# Patient Record
Sex: Female | Born: 1960 | Race: Black or African American | Hispanic: No | Marital: Married | Smoking: Never smoker
Health system: Southern US, Community
[De-identification: ages and names within clinical notes are randomized; demographics above are authoritative.]

## PROBLEM LIST (undated history)

## (undated) DIAGNOSIS — R079 Chest pain, unspecified: Secondary | ICD-10-CM

## (undated) DIAGNOSIS — I10 Essential (primary) hypertension: Secondary | ICD-10-CM

## (undated) DIAGNOSIS — M5430 Sciatica, unspecified side: Secondary | ICD-10-CM

## (undated) DIAGNOSIS — E669 Obesity, unspecified: Secondary | ICD-10-CM

## (undated) DIAGNOSIS — D122 Benign neoplasm of ascending colon: Secondary | ICD-10-CM

## (undated) DIAGNOSIS — H9193 Unspecified hearing loss, bilateral: Secondary | ICD-10-CM

## (undated) DIAGNOSIS — M67912 Unspecified disorder of synovium and tendon, left shoulder: Secondary | ICD-10-CM

## (undated) DIAGNOSIS — R9431 Abnormal electrocardiogram [ECG] [EKG]: Secondary | ICD-10-CM

## (undated) DIAGNOSIS — M67911 Unspecified disorder of synovium and tendon, right shoulder: Secondary | ICD-10-CM

## (undated) HISTORY — DX: Essential (primary) hypertension: I10

## (undated) HISTORY — DX: Unspecified disorder of synovium and tendon, right shoulder: M67.912

## (undated) HISTORY — DX: Abnormal electrocardiogram (ECG) (EKG): R94.31

## (undated) HISTORY — DX: Unspecified disorder of synovium and tendon, right shoulder: M67.911

## (undated) HISTORY — DX: Unspecified hearing loss, bilateral: H91.93

## (undated) HISTORY — DX: Sciatica, unspecified side: M54.30

## (undated) HISTORY — DX: Chest pain, unspecified: R07.9

## (undated) HISTORY — DX: Benign neoplasm of ascending colon: D12.2

## (undated) HISTORY — DX: Obesity, unspecified: E66.9

---

## 1999-04-05 ENCOUNTER — Other Ambulatory Visit: Admission: RE | Admit: 1999-04-05 | Discharge: 1999-04-05 | Payer: Self-pay | Admitting: Obstetrics and Gynecology

## 2006-07-11 ENCOUNTER — Encounter: Admission: RE | Admit: 2006-07-11 | Discharge: 2006-07-11 | Payer: Self-pay | Admitting: Internal Medicine

## 2006-08-09 ENCOUNTER — Encounter: Admission: RE | Admit: 2006-08-09 | Discharge: 2006-08-09 | Payer: Self-pay | Admitting: Internal Medicine

## 2006-09-05 ENCOUNTER — Encounter: Admission: RE | Admit: 2006-09-05 | Discharge: 2006-09-05 | Payer: Self-pay | Admitting: Internal Medicine

## 2010-11-29 ENCOUNTER — Other Ambulatory Visit
Admission: RE | Admit: 2010-11-29 | Discharge: 2010-11-29 | Payer: Self-pay | Source: Home / Self Care | Admitting: Family Medicine

## 2010-12-27 ENCOUNTER — Encounter
Admission: RE | Admit: 2010-12-27 | Discharge: 2010-12-27 | Payer: Self-pay | Source: Home / Self Care | Attending: Family Medicine | Admitting: Family Medicine

## 2011-01-07 ENCOUNTER — Encounter
Admission: RE | Admit: 2011-01-07 | Discharge: 2011-01-07 | Payer: Self-pay | Source: Home / Self Care | Attending: Family Medicine | Admitting: Family Medicine

## 2011-01-08 ENCOUNTER — Other Ambulatory Visit: Payer: Self-pay | Admitting: Family Medicine

## 2011-01-08 DIAGNOSIS — N6320 Unspecified lump in the left breast, unspecified quadrant: Secondary | ICD-10-CM

## 2011-01-09 ENCOUNTER — Encounter: Payer: Self-pay | Admitting: Family Medicine

## 2011-01-24 ENCOUNTER — Other Ambulatory Visit: Payer: Self-pay | Admitting: Family Medicine

## 2011-01-24 DIAGNOSIS — N6320 Unspecified lump in the left breast, unspecified quadrant: Secondary | ICD-10-CM

## 2011-01-27 ENCOUNTER — Other Ambulatory Visit: Payer: Self-pay

## 2011-03-01 ENCOUNTER — Other Ambulatory Visit: Payer: Self-pay

## 2011-11-04 IMAGING — MG MM DIAGNOSTIC LTD LEFT
3 series · 3 of 3 positions shown · non-contrast
Comparison: Multiple priors

CLINICAL DATA: Abnormal screening mammogram, left breast

DIGITAL DIAGNOSTIC LEFT MAMMOGRAM WITHOUT CAD AND LEFT BREAST
ULTRASOUND:

[L CC]
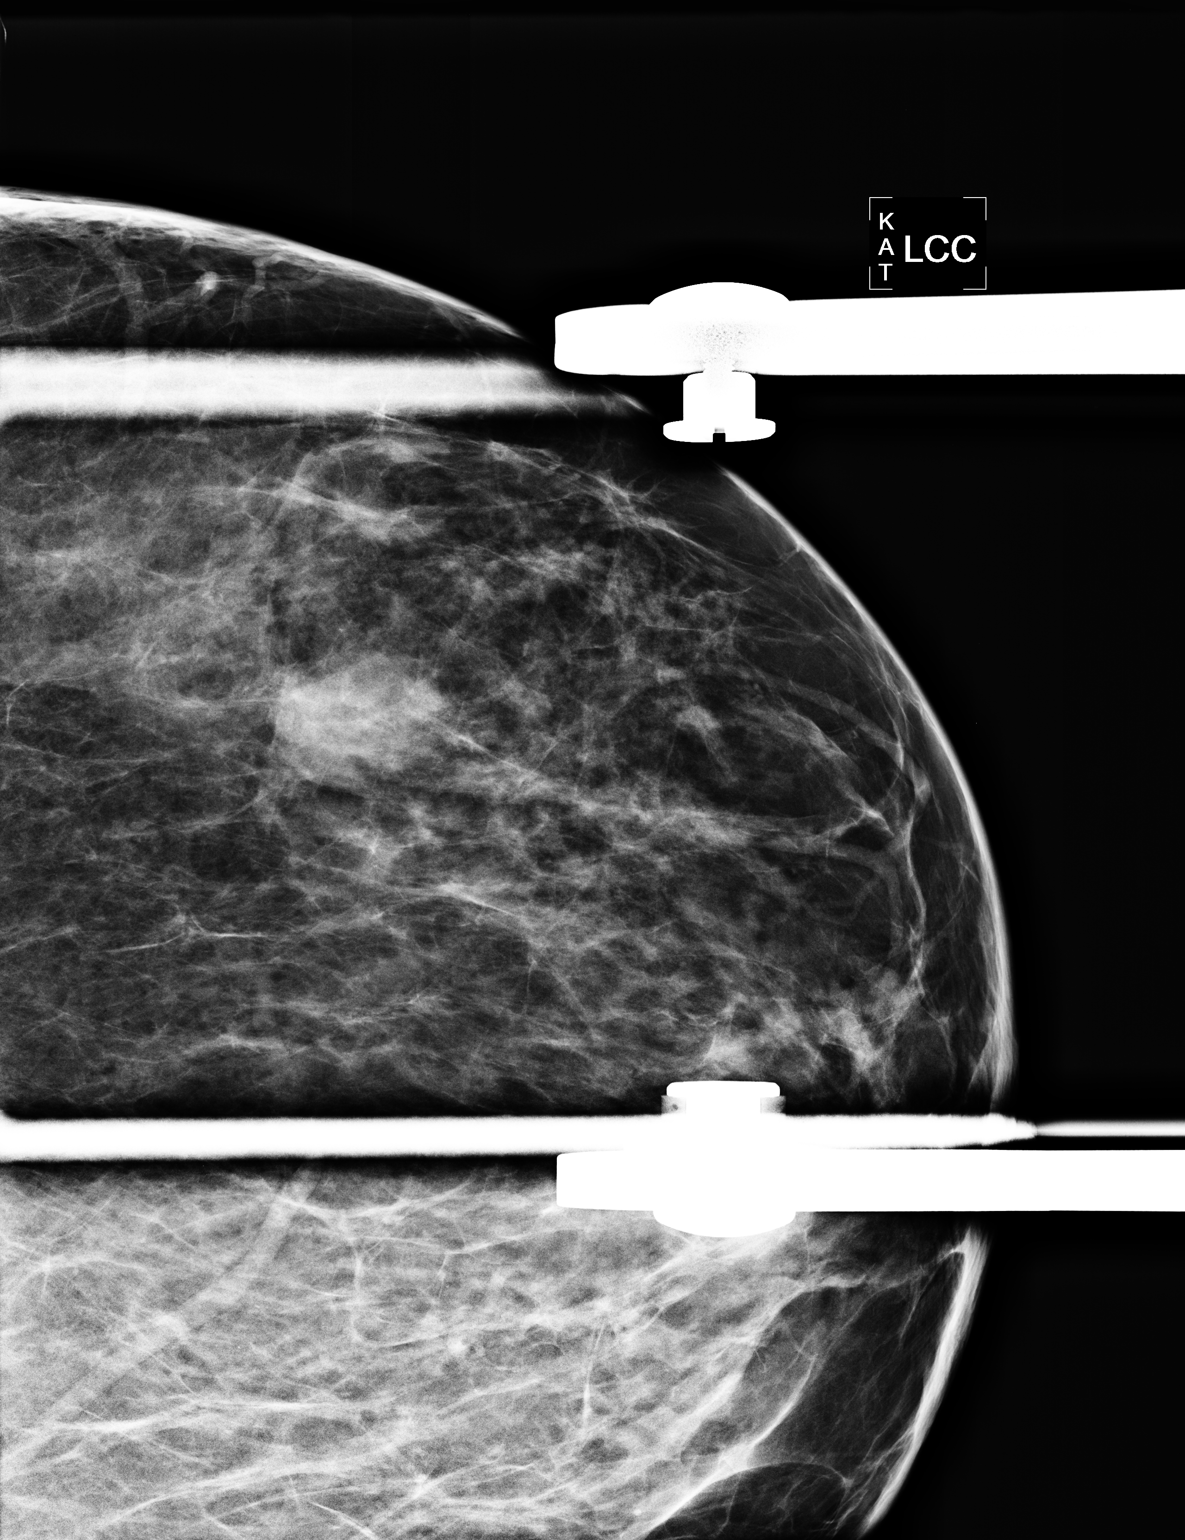

[L MLO (1 of 2)]
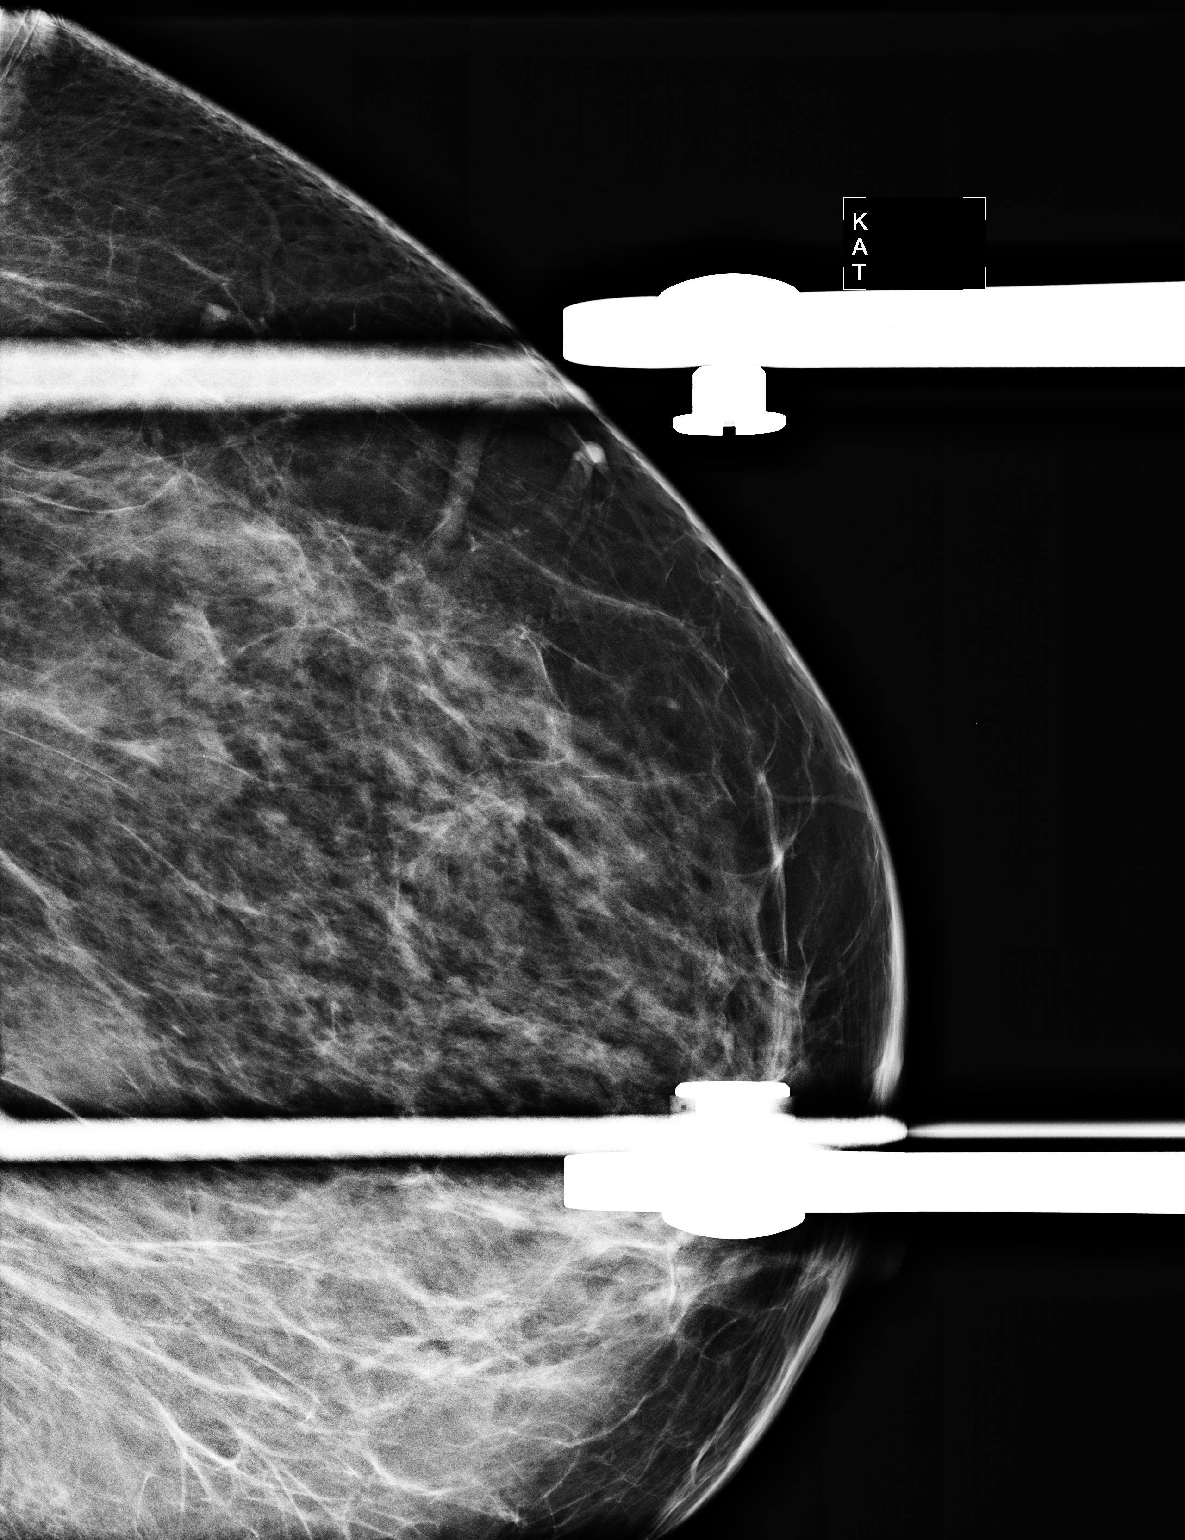

[L MLO (2 of 2)]
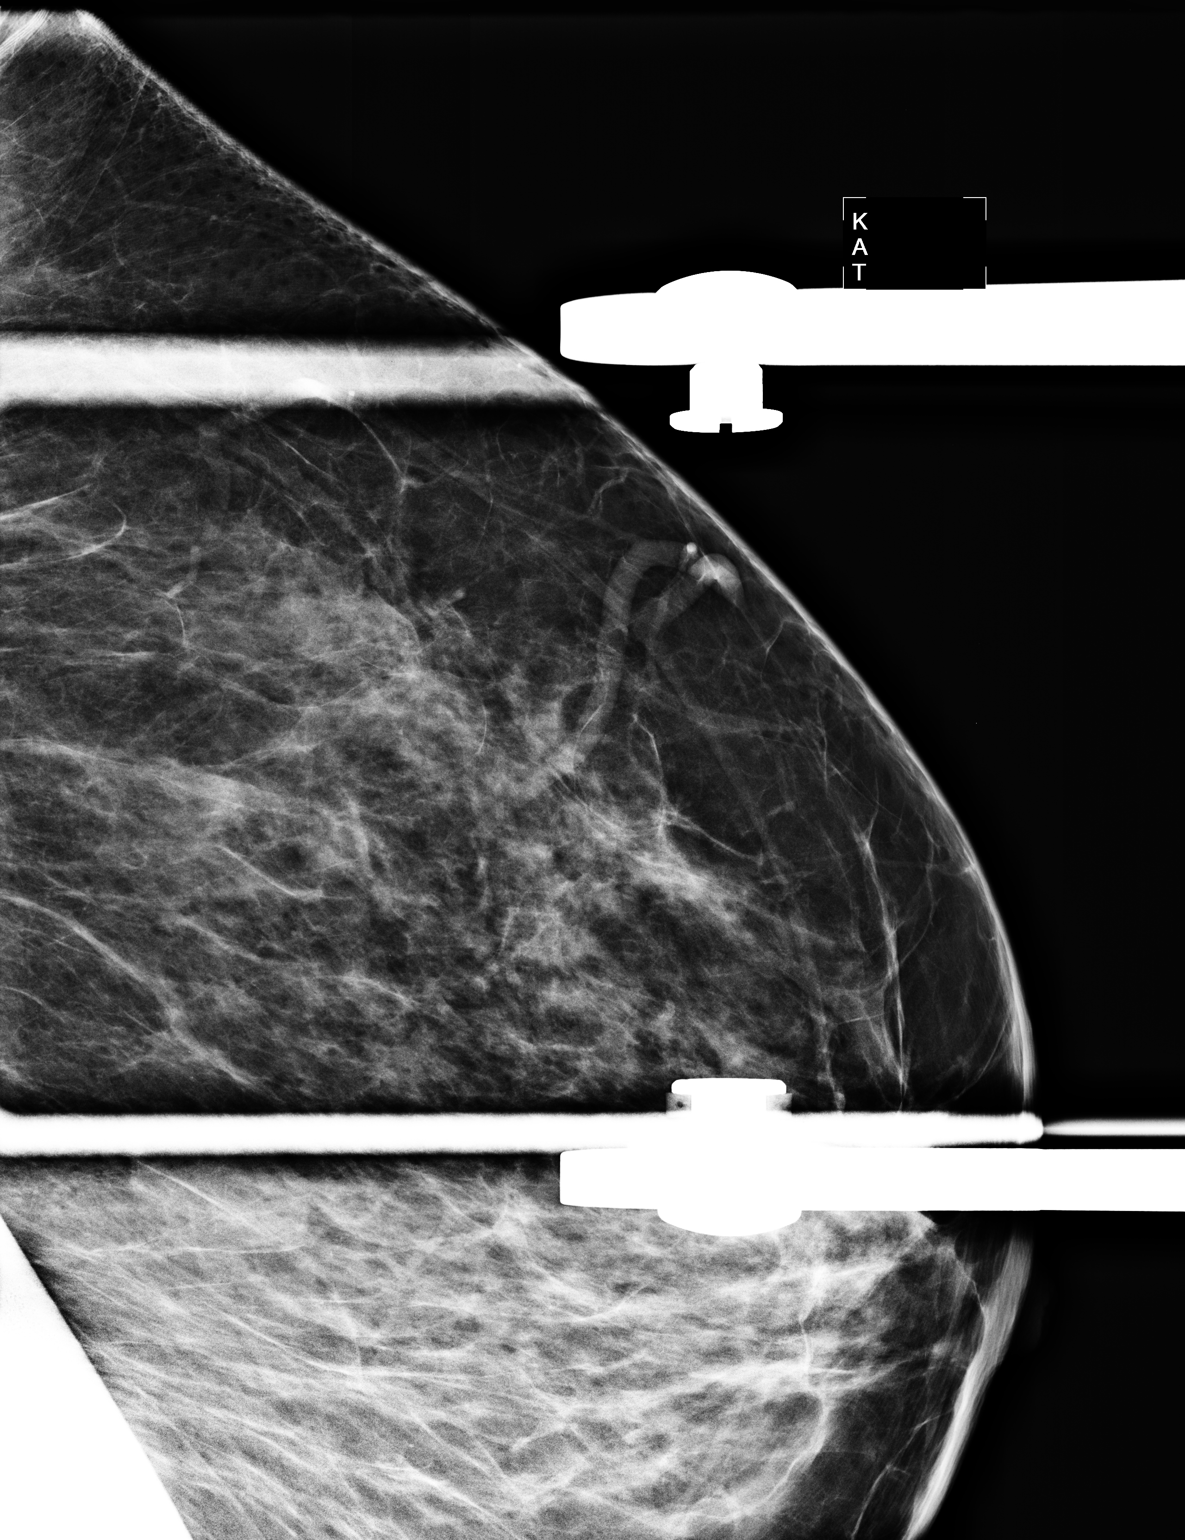

[3 of 3 positions shown; findings below may reference images not displayed]

FINDINGS: Spot compression views confirm the presence of a mass in
the lateral central aspect of the left breast, middle one third
with partially circumscribed partially indistinct margins.

On physical exam, I palpate normal tissue in the lateral central
aspect of the left breast.

Ultrasound is performed, showing a predominantly hypoechoic mass
with ill-defined margins and peripheral areas of increased
echogenicity and some areas of shadowing in the 3 o'clock position,
5 cm from the nipple measuring 1.9 x 1.0 x 1.6 cm.  Normal axillary
contents are imaged.

Options were discussed with the patient including 6-month follow-up
versus biopsy.  However, considering this is a change from the
mammogram, biopsy is recommended and will be scheduled as an the
patient is able.
IMPRESSION: Mass, left breast which biopsy is recommended in will be scheduled
at the patient's convenience.

BI-RADS CATEGORY 4:  Suspicious abnormality - biopsy should be
considered.

## 2016-07-13 ENCOUNTER — Ambulatory Visit: Payer: Self-pay | Admitting: Neurology

## 2019-06-24 ENCOUNTER — Other Ambulatory Visit: Payer: Self-pay | Admitting: Internal Medicine

## 2019-06-24 ENCOUNTER — Other Ambulatory Visit: Payer: Self-pay

## 2019-06-24 DIAGNOSIS — Z20822 Contact with and (suspected) exposure to covid-19: Secondary | ICD-10-CM

## 2019-06-29 LAB — NOVEL CORONAVIRUS, NAA: SARS-CoV-2, NAA: NOT DETECTED

## 2019-07-25 ENCOUNTER — Encounter: Payer: Self-pay | Admitting: Interventional Cardiology

## 2019-08-14 ENCOUNTER — Other Ambulatory Visit: Payer: Self-pay

## 2019-08-14 ENCOUNTER — Ambulatory Visit (INDEPENDENT_AMBULATORY_CARE_PROVIDER_SITE_OTHER): Payer: Managed Care, Other (non HMO) | Admitting: Interventional Cardiology

## 2019-08-14 ENCOUNTER — Encounter: Payer: Self-pay | Admitting: Interventional Cardiology

## 2019-08-14 VITALS — BP 128/76 | HR 62 | Ht 67.0 in | Wt 244.4 lb

## 2019-08-14 DIAGNOSIS — M329 Systemic lupus erythematosus, unspecified: Secondary | ICD-10-CM

## 2019-08-14 DIAGNOSIS — I10 Essential (primary) hypertension: Secondary | ICD-10-CM

## 2019-08-14 DIAGNOSIS — R0789 Other chest pain: Secondary | ICD-10-CM

## 2019-08-14 DIAGNOSIS — E668 Other obesity: Secondary | ICD-10-CM

## 2019-08-14 DIAGNOSIS — R0683 Snoring: Secondary | ICD-10-CM

## 2019-08-14 DIAGNOSIS — Z7189 Other specified counseling: Secondary | ICD-10-CM

## 2019-08-14 DIAGNOSIS — R9431 Abnormal electrocardiogram [ECG] [EKG]: Secondary | ICD-10-CM

## 2019-08-14 NOTE — Patient Instructions (Addendum)
Medication Instructions:  Your physician recommends that you continue on your current medications as directed. Please refer to the Current Medication list given to you today.  If you need a refill on your cardiac medications before your next appointment, please call your pharmacy.   Lab work: Lipid and A1C the same day as your echocardiogram.  Please make sure you come fasting for these labs (nothing to eat or drink after midnight except water and black coffee).  If you have labs (blood work) drawn today and your tests are completely normal, you will receive your results only by: Marland Kitchen MyChart Message (if you have MyChart) OR . A paper copy in the mail If you have any lab test that is abnormal or we need to change your treatment, we will call you to review the results.  Testing/Procedures: Your physician has requested that you have an echocardiogram. Echocardiography is a painless test that uses sound waves to create images of your heart. It provides your doctor with information about the size and shape of your heart and how well your heart's chambers and valves are working. This procedure takes approximately one hour. There are no restrictions for this procedure.  Follow-Up: Your physician recommends that you schedule a follow-up appointment as needed with Dr. Tamala Julian.    Any Other Special Instructions Will Be Listed Below (If Applicable).

## 2019-08-14 NOTE — Progress Notes (Signed)
Cardiology Office Note:    Date:  08/14/2019   ID:  Kristi Hunt, DOB 1961/08/28, MRN [id number]  PCP:  Aura Dials, MD  Cardiologist:  No primary care provider on file.   Referring MD: Carolee Rota, NP   Chief Complaint  Patient presents with  . Advice Only    Abnormal ECG    History of Present Illness:    Kristi Hunt is a 58 y.o. female with a hx of abnormal EKG with diagnosis of possible prior myocardial infarction referred by Dr. Loura Pardon, NP.  The patient has had intermittent chest burning for several months.  Pantoprazole is been started in the chest symptom improved.  In July she had sudden onset of nausea, vomiting, and worsening of the discomfort with discomfort in her arm.  Several days later she went into the [facility] office, was evaluated, had an EKG, and the diagnosis was "anteroseptal MI".  She was told by the provider that she had had a recent myocardial infarction.  That is the reason that she is here today.  Since learning this, the patient has been concerned about the possibility of heart disease.  She is concerned that her job at [employer] could be aggravating her underlying condition.  She does a lot of lifting and physical activity at work and is not bothered by chest discomfort or excessive dyspnea.  Other components of her history include a longstanding hypertension diagnosis for greater than 15 years.  Marginal blood pressure control.  History of snoring and fatigue during the daytime.  She has never been a smoker.  She does have a positive family history of vascular disease including hypertension in both her mother and father and a brother has had aortic dissection.  Past Medical History:  Diagnosis Date  . Abnormal EKG   . Adenomatous polyp of ascending colon   . Chest pain   . Hearing loss, bilateral   . Hypertension   . Obesity   . Sciatica   . Tendinopathy of both shoulders     Past Surgical History:  Procedure  Laterality Date  . NO PAST SURGERIES      Current Medications: Current Meds  Medication Sig  . aspirin EC 81 MG tablet Take 81 mg by mouth daily.  . hydrochlorothiazide (HYDRODIURIL) 25 MG tablet Take 25 mg by mouth daily.  . hydroxychloroquine (PLAQUENIL) 200 MG tablet Take 200 mg by mouth daily.  Marland Kitchen lisinopril (ZESTRIL) 20 MG tablet Take 20 mg by mouth daily.  . metoprolol succinate (TOPROL-XL) 50 MG 24 hr tablet Take 50 mg by mouth daily. Take with or immediately following a meal.  . pantoprazole (PROTONIX) 40 MG tablet Take 40 mg by mouth daily.     Allergies:   Penicillins   Social History   Socioeconomic History  . Marital status: Unknown    Spouse name: Not on file  . Number of children: Not on file  . Years of education: Not on file  . Highest education level: Not on file  Occupational History  . Not on file  Social Needs  . Financial resource strain: Not on file  . Food insecurity    Worry: Not on file    Inability: Not on file  . Transportation needs    Medical: Not on file    Non-medical: Not on file  Tobacco Use  . Smoking status: Never Smoker  . Smokeless tobacco: Never Used  Substance and Sexual Activity  . Alcohol use: Never  Frequency: Never  . Drug use: Never  . Sexual activity: Not on file    Comment: MARRIED  Lifestyle  . Physical activity    Days per week: Not on file    Minutes per session: Not on file  . Stress: Not on file  Relationships  . Social Herbalist on phone: Not on file    Gets together: Not on file    Attends religious service: Not on file    Active member of club or organization: Not on file    Attends meetings of clubs or organizations: Not on file    Relationship status: Not on file  Other Topics Concern  . Not on file  Social History Narrative  . Not on file     Family History: The patient's family history includes Aortic dissection in her brother; Cancer in her father; Healthy in her daughter, daughter  and another family member; Heart disease in her father and mother; Hypertension in her brother, father, and mother; Other in her brother and maternal aunt.  ROS:   Please see the history of present illness.    Lupus erythematosus is a current diagnosis for which she takes hydrochloric when.  She is under stress at work and also about her health.  When she gets home from work she has to take a nap because she is so fatigued and gets sleepy quickly.  All other systems reviewed and are negative.  EKGs/Labs/Other Studies Reviewed:    The following studies were reviewed today: No cardiac evaluation or imaging  EKG:  EKG EKG performed today demonstrates normal appearance.  Normal sinus rhythm at 62 bpm.  No evidence of infarction.  When compared to the tracing performed at [facility] on May 25, 2019, the only difference is that there is now normal QRS transition in the precordial leads versus then.  The computer algorithm on that tracing read anteroseptal infarct.  This likely confirms that a lead position change is what accounted for the infarct pattern and was normalized on EKG today with proper positioning.  Recent Labs: No results found for requested labs within last 8760 hours.  Recent Lipid Panel No results found for: CHOL, TRIG, HDL, CHOLHDL, VLDL, LDLCALC, LDLDIRECT  Physical Exam:    VS:  BP 128/76   Pulse 62   Ht 5\' 7"  (1.702 m)   Wt 244 lb 6.4 oz (110.9 kg)   SpO2 98%   BMI 38.28 kg/m     Wt Readings from Last 3 Encounters:  08/14/19 244 lb 6.4 oz (110.9 kg)     GEN: Obese. No acute distress HEENT: Normal NECK: No JVD. LYMPHATICS: No lymphadenopathy CARDIAC:  RRR without murmur, gallop, or edema. VASCULAR:  Normal Pulses. No bruits. RESPIRATORY:  Clear to auscultation without rales, wheezing or rhonchi  ABDOMEN: Soft, non-tender, non-distended, No pulsatile mass, MUSCULOSKELETAL: No deformity  SKIN: Warm and dry NEUROLOGIC:  Alert and oriented x 3 PSYCHIATRIC:   Normal affect   ASSESSMENT:    1. Abnormal EKG   2. Other chest pain   3. Essential hypertension   4. Snoring   5. Systemic lupus erythematosus, unspecified SLE type, unspecified organ involvement status ([place])   6. Moderate obesity   7. Educated About Covid-19 Virus Infection    PLAN:    In order of problems listed above:  1. Today's EKG is normal.  I believe the EKG that raise the concern have the appearance of anteroseptal infarct because of lead position  variance. 2. A 2D Doppler echocardiogram will be done to help investigate her complaint of chest pain.  She has potential explanations other than coronary disease including lupus which can be associated with pericarditis.  Her story also suggest the possibility of sleep apnea.  The echo will help to exclude pulmonary hypertension.  Finally the echo will rule out a regional wall motion abnormality. 3. Target blood pressure is 130/80 mmHg.  Continue current therapy. 4. Depending upon findings from the echo, we may need to consider a sleep study. 5. Not specifically addressed. 6. Encourage aerobic activity and decrease carbohydrate in her diet. 7. Social distancing, mask wearing, and handwashing is encouraged.  To properly assess cardiovascular risk for possible future cardiac events she will need to have a hemoglobin A1c, lipid panel, and we will consider a coronary calcium score and sleep study.  Clinical follow-up with me in 4 to 6 weeks after more data is available.   Medication Adjustments/Labs and Tests Ordered: Current medicines are reviewed at length with the patient today.  Concerns regarding medicines are outlined above.  Orders Placed This Encounter  Procedures  . HgB A1c  . Lipid panel  . EKG 12-Lead  . ECHOCARDIOGRAM COMPLETE   No orders of the defined types were placed in this encounter.   Patient Instructions  Medication Instructions:  Your physician recommends that you continue on your current medications as  directed. Please refer to the Current Medication list given to you today.  If you need a refill on your cardiac medications before your next appointment, please call your pharmacy.   Lab work: Lipid and A1C the same day as your echocardiogram.  Please make sure you come fasting for these labs (nothing to eat or drink after midnight except water and black coffee).  If you have labs (blood work) drawn today and your tests are completely normal, you will receive your results only by: Marland Kitchen MyChart Message (if you have MyChart) OR . A paper copy in the mail If you have any lab test that is abnormal or we need to change your treatment, we will call you to review the results.  Testing/Procedures: Your physician has requested that you have an echocardiogram. Echocardiography is a painless test that uses sound waves to create images of your heart. It provides your doctor with information about the size and shape of your heart and how well your heart's chambers and valves are working. This procedure takes approximately one hour. There are no restrictions for this procedure.  Follow-Up: Your physician recommends that you schedule a follow-up appointment as needed with Dr. Tamala Julian.    Any Other Special Instructions Will Be Listed Below (If Applicable).      Signed, Sinclair Grooms, MD  08/14/2019 3:33 PM    [facility]

## 2019-08-21 ENCOUNTER — Other Ambulatory Visit: Payer: Self-pay

## 2019-08-21 ENCOUNTER — Other Ambulatory Visit: Payer: Managed Care, Other (non HMO) | Admitting: *Deleted

## 2019-08-21 ENCOUNTER — Ambulatory Visit (HOSPITAL_COMMUNITY): Payer: Managed Care, Other (non HMO) | Attending: Cardiovascular Disease

## 2019-08-21 DIAGNOSIS — R9431 Abnormal electrocardiogram [ECG] [EKG]: Secondary | ICD-10-CM

## 2019-08-21 DIAGNOSIS — R0789 Other chest pain: Secondary | ICD-10-CM

## 2019-08-22 LAB — LIPID PANEL
Chol/HDL Ratio: 2.3 ratio (ref 0.0–4.4)
Cholesterol, Total: 228 mg/dL — ABNORMAL HIGH (ref 100–199)
HDL: 98 mg/dL (ref >39)
LDL Chol Calc (NIH): 122 mg/dL — ABNORMAL HIGH (ref 0–99)
Triglycerides: 47 mg/dL (ref 0–149)
VLDL Cholesterol Cal: 8 mg/dL (ref 5–40)

## 2019-08-22 LAB — HEMOGLOBIN A1C
Est. average glucose Bld gHb Est-mCnc: 123 mg/dL
Hgb A1c MFr Bld: 5.9 % — ABNORMAL HIGH (ref 4.8–5.6)

## 2019-08-23 ENCOUNTER — Telehealth: Payer: Self-pay | Admitting: Interventional Cardiology

## 2019-08-23 ENCOUNTER — Telehealth: Payer: Self-pay

## 2019-08-23 NOTE — Telephone Encounter (Signed)
New Message ° ° °Patient returning your call. °

## 2019-08-23 NOTE — Telephone Encounter (Signed)
Notes recorded by Frederik Schmidt, RN on 08/23/2019 at 11:30 AM EDT  The patient has been notified of the result and verbalized understanding. All questions (if any) were answered.  Frederik Schmidt, RN 08/23/2019 11:30 AM

## 2019-08-23 NOTE — Telephone Encounter (Signed)
Notes recorded by Frederik Schmidt, RN on 08/23/2019 at 9:37 AM EDT  Mailbox full 9/4  ------

## 2020-01-17 ENCOUNTER — Other Ambulatory Visit: Payer: Self-pay

## 2020-01-17 ENCOUNTER — Ambulatory Visit: Payer: Managed Care, Other (non HMO) | Attending: Internal Medicine

## 2020-01-17 DIAGNOSIS — Z20822 Contact with and (suspected) exposure to covid-19: Secondary | ICD-10-CM

## 2020-01-18 LAB — NOVEL CORONAVIRUS, NAA: SARS-CoV-2, NAA: NOT DETECTED

## 2020-01-23 ENCOUNTER — Telehealth: Payer: Self-pay | Admitting: *Deleted

## 2020-01-23 NOTE — Telephone Encounter (Signed)
Pt advised that her covid 19 result is negative. She voiced understanding.

## 2020-03-24 NOTE — Progress Notes (Signed)
Cardiology Office Note:    Date:  03/25/2020   ID:  Kristi Hunt, DOB 1961/01/25, MRN [id number]  PCP:  Aura Dials, MD  Cardiologist:  Sinclair Grooms, MD   Referring MD: Aura Dials, MD   Chief Complaint  Patient presents with  . Chest Pain    History of Present Illness:    Kristi Hunt is a 59 y.o. female with a hx of chest pain, hypertension, abnormal resting ECG, obesity, SLE, and small pericardial effusion 08/2019.  Says that she feels better now.  Has not had recurrent chest pain.  She does carry the diagnosis of lupus and is on hydrochloric when 200 mg daily.  She had right rotator cuff tear and has been out of work for the past several months recuperating from that.  She has back at low low intensity duty at work and it seems to be doing okay.  She was seen last, complained of chest pain, in retrospect she realizes that she had been drinking contaminated juice from a container that had black mold.  She wonders if she was having any kind of reaction to that.  Past Medical History:  Diagnosis Date  . Abnormal EKG   . Adenomatous polyp of ascending colon   . Chest pain   . Hearing loss, bilateral   . Hypertension   . Obesity   . Sciatica   . Tendinopathy of both shoulders     Past Surgical History:  Procedure Laterality Date  . NO PAST SURGERIES      Current Medications: Current Meds  Medication Sig  . aspirin EC 81 MG tablet Take 81 mg by mouth daily.  . hydrochlorothiazide (HYDRODIURIL) 25 MG tablet Take 25 mg by mouth daily.  . hydroxychloroquine (PLAQUENIL) 200 MG tablet Take 200 mg by mouth daily.  Marland Kitchen lisinopril (ZESTRIL) 20 MG tablet Take 20 mg by mouth daily.  . metoprolol succinate (TOPROL-XL) 50 MG 24 hr tablet Take 50 mg by mouth daily. Take with or immediately following a meal.     Allergies:   Penicillins   Social History   Socioeconomic History  . Marital status: Unknown    Spouse name: Not on file  . Number of children: Not on file    . Years of education: Not on file  . Highest education level: Not on file  Occupational History  . Not on file  Tobacco Use  . Smoking status: Never Smoker  . Smokeless tobacco: Never Used  Substance and Sexual Activity  . Alcohol use: Never  . Drug use: Never  . Sexual activity: Not on file    Comment: MARRIED  Other Topics Concern  . Not on file  Social History Narrative  . Not on file   Social Determinants of Health   Financial Resource Strain:   . Difficulty of Paying Living Expenses:   Food Insecurity:   . Worried About Charity fundraiser in the Last Year:   . Arboriculturist in the Last Year:   Transportation Needs:   . Film/video editor (Medical):   Marland Kitchen Lack of Transportation (Non-Medical):   Physical Activity:   . Days of Exercise per Week:   . Minutes of Exercise per Session:   Stress:   . Feeling of Stress :   Social Connections:   . Frequency of Communication with Friends and Family:   . Frequency of Social Gatherings with Friends and Family:   . Attends Religious Services:   . Active  Member of Clubs or Organizations:   . Attends Archivist Meetings:   Marland Kitchen Marital Status:      Family History: The patient's family history includes Aortic dissection in her brother; Cancer in her father; Healthy in her daughter, daughter and another family member; Heart disease in her father and mother; Hypertension in her brother, father, and mother; Other in her brother and maternal aunt.  ROS:   Please see the history of present illness.    Lupus causes her joints it feels stiff.  No rash or kidney involvement.  All other systems reviewed and are negative.  EKGs/Labs/Other Studies Reviewed:    The following studies were reviewed today:  ECHOCARDIOGRAM 08/2019: IMPRESSIONS    1. Normal echocardiogram.  2. The left ventricle has normal systolic function with an ejection  fraction of 60-65%. The cavity size was normal. Left ventricular diastolic   parameters were normal.  3. The right ventricle has normal systolic function. The cavity was  normal. There is no increase in right ventricular wall thickness. Right  ventricular systolic pressure TR signal inadequate for RVSP assessment.  4. The mitral valve is grossly normal. Mild thickening of the mitral  valve leaflet. There is mild mitral annular calcification present.  5. The aortic valve is tricuspid. No stenosis of the aortic valve.  6. The aorta is normal unless otherwise noted.  7. The aortic root is normal in size and structure.  8. The average left ventricular global longitudinal strain is -19.5 %.  9. When compared to the prior study: No comparison.  10. Trivial pericardial effusion is present.  11. The pericardial effusion is circumferential.   EKG:  EKG performed in August 2020 demonstrating prominent voltage, normal sinus rhythm, and no acute abnormality.  Recent Labs: No results found for requested labs within last 8760 hours.  Recent Lipid Panel    Component Value Date/Time   CHOL 228 (H) 08/21/2019 1018   TRIG 47 08/21/2019 1018   HDL 98 08/21/2019 1018   CHOLHDL 2.3 08/21/2019 1018   LDLCALC 122 (H) 08/21/2019 1018    Physical Exam:    VS:  BP 122/72   Pulse 74   Ht 5\' 7"  (1.702 m)   Wt 254 lb 12.8 oz (115.6 kg)   SpO2 97%   BMI 39.91 kg/m     Wt Readings from Last 3 Encounters:  03/25/20 254 lb 12.8 oz (115.6 kg)  08/14/19 244 lb 6.4 oz (110.9 kg)     GEN: Obese. No acute distress HEENT: Normal NECK: No JVD. LYMPHATICS: No lymphadenopathy CARDIAC:  RRR without murmur, gallop, or edema. VASCULAR:  Normal Pulses. No bruits. RESPIRATORY:  Clear to auscultation without rales, wheezing or rhonchi  ABDOMEN: Soft, non-tender, non-distended, No pulsatile mass, MUSCULOSKELETAL: No deformity  SKIN: Warm and dry NEUROLOGIC:  Alert and oriented x 3 PSYCHIATRIC:  Normal affect   ASSESSMENT:    1. Other chest pain   2. Essential hypertension    3. Systemic lupus erythematosus, unspecified SLE type, unspecified organ involvement status ([place])   4. Snoring   5. Moderate obesity   6. Educated about COVID-19 virus infection   7. Pericarditis with effusion    PLAN:    In order of problems listed above:  1. The chest pain that she had when seen late last year has not recurred.  The echo in conjunction with evaluation revealed a small circumferential pericardial effusion.  With her history of lupus it is conceivable that pericarditis was present related  to systemic lupus erythematosus.  I helped her to understand a potential connection.  In absence of pain or symptoms at this time, I do not feel that any further evaluation is currently needed.  Should she develop shortness of breath or recurrent chest pain, an echo should be repeated. 2. Excellent current blood pressure 3. Under good control on hydrochloric when 4. May need evaluation for sleep apnea 5. Unchanged 6. Has gotten 2 doses of COVID-19 vaccine 7. I made a correlation between pericardial effusion and lupus.  This should be linked in the future if symptoms are consistent.  As needed cardiac follow-up as needed   Medication Adjustments/Labs and Tests Ordered: Current medicines are reviewed at length with the patient today.  Concerns regarding medicines are outlined above.  No orders of the defined types were placed in this encounter.  No orders of the defined types were placed in this encounter.   Patient Instructions  Medication Instructions:  Your physician recommends that you continue on your current medications as directed. Please refer to the Current Medication list given to you today.  *If you need a refill on your cardiac medications before your next appointment, please call your pharmacy*   Lab Work: None If you have labs (blood work) drawn today and your tests are completely normal, you will receive your results only by: Marland Kitchen MyChart Message (if you have  MyChart) OR . A paper copy in the mail If you have any lab test that is abnormal or we need to change your treatment, we will call you to review the results.   Testing/Procedures: None   Follow-Up: At [facility], you and your health needs are our priority.  As part of our continuing mission to provide you with exceptional heart care, we have created designated Provider Care Teams.  These Care Teams include your primary Cardiologist (physician) and Advanced Practice Providers (APPs -  Physician Assistants and Nurse Practitioners) who all work together to provide you with the care you need, when you need it.  We recommend signing up for the patient portal called "MyChart".  Sign up information is provided on this After Visit Summary.  MyChart is used to connect with patients for Virtual Visits (Telemedicine).  Patients are able to view lab/test results, encounter notes, upcoming appointments, etc.  Non-urgent messages can be sent to your provider as well.   To learn more about what you can do with MyChart, go to [email or web address].    Your next appointment:   As needed  The format for your next appointment:   In Person  Provider:   You may see Sinclair Grooms, MD or one of the following Advanced Practice Providers on your designated Care Team:    Truitt Merle, NP  Cecilie Kicks, NP  Kathyrn Drown, NP    Other Instructions      Signed, Sinclair Grooms, MD  03/25/2020 4:53 PM    [place]

## 2020-03-25 ENCOUNTER — Encounter: Payer: Self-pay | Admitting: Interventional Cardiology

## 2020-03-25 ENCOUNTER — Ambulatory Visit (INDEPENDENT_AMBULATORY_CARE_PROVIDER_SITE_OTHER): Payer: Managed Care, Other (non HMO) | Admitting: Interventional Cardiology

## 2020-03-25 ENCOUNTER — Other Ambulatory Visit: Payer: Self-pay

## 2020-03-25 VITALS — BP 122/72 | HR 74 | Ht 67.0 in | Wt 254.8 lb

## 2020-03-25 DIAGNOSIS — I319 Disease of pericardium, unspecified: Secondary | ICD-10-CM

## 2020-03-25 DIAGNOSIS — M329 Systemic lupus erythematosus, unspecified: Secondary | ICD-10-CM

## 2020-03-25 DIAGNOSIS — R0789 Other chest pain: Secondary | ICD-10-CM

## 2020-03-25 DIAGNOSIS — Z7189 Other specified counseling: Secondary | ICD-10-CM

## 2020-03-25 DIAGNOSIS — R0683 Snoring: Secondary | ICD-10-CM

## 2020-03-25 DIAGNOSIS — I10 Essential (primary) hypertension: Secondary | ICD-10-CM

## 2020-03-25 DIAGNOSIS — E668 Other obesity: Secondary | ICD-10-CM

## 2020-03-25 NOTE — Patient Instructions (Signed)
Medication Instructions:  Your physician recommends that you continue on your current medications as directed. Please refer to the Current Medication list given to you today.  *If you need a refill on your cardiac medications before your next appointment, please call your pharmacy*   Lab Work: None If you have labs (blood work) drawn today and your tests are completely normal, you will receive your results only by: . MyChart Message (if you have MyChart) OR . A paper copy in the mail If you have any lab test that is abnormal or we need to change your treatment, we will call you to review the results.   Testing/Procedures: None   Follow-Up: At [facility], you and your health needs are our priority.  As part of our continuing mission to provide you with exceptional heart care, we have created designated Provider Care Teams.  These Care Teams include your primary Cardiologist (physician) and Advanced Practice Providers (APPs -  Physician Assistants and Nurse Practitioners) who all work together to provide you with the care you need, when you need it.  We recommend signing up for the patient portal called "MyChart".  Sign up information is provided on this After Visit Summary.  MyChart is used to connect with patients for Virtual Visits (Telemedicine).  Patients are able to view lab/test results, encounter notes, upcoming appointments, etc.  Non-urgent messages can be sent to your provider as well.   To learn more about what you can do with MyChart, go to [email or web address].    Your next appointment:   As needed  The format for your next appointment:   In Person  Provider:   You may see Henry W Smith III, MD or one of the following Advanced Practice Providers on your designated Care Team:    Lori Gerhardt, NP  Laura Ingold, NP  Jill McDaniel, NP    Other Instructions   

## 2020-06-19 ENCOUNTER — Other Ambulatory Visit (HOSPITAL_COMMUNITY): Payer: Self-pay | Admitting: Family Medicine

## 2020-06-19 DIAGNOSIS — Z1231 Encounter for screening mammogram for malignant neoplasm of breast: Secondary | ICD-10-CM

## 2020-06-23 ENCOUNTER — Other Ambulatory Visit (HOSPITAL_COMMUNITY): Payer: Self-pay | Admitting: Family Medicine

## 2020-06-23 DIAGNOSIS — N63 Unspecified lump in unspecified breast: Secondary | ICD-10-CM

## 2020-06-29 ENCOUNTER — Ambulatory Visit (HOSPITAL_COMMUNITY): Payer: Managed Care, Other (non HMO)

## 2020-07-07 ENCOUNTER — Other Ambulatory Visit: Payer: Self-pay

## 2020-07-07 ENCOUNTER — Ambulatory Visit (HOSPITAL_COMMUNITY)
Admission: RE | Admit: 2020-07-07 | Discharge: 2020-07-07 | Disposition: A | Discharge status: Home / Self Care | Payer: Managed Care, Other (non HMO) | Source: Ambulatory Visit | Attending: Family Medicine | Admitting: Family Medicine

## 2020-07-07 DIAGNOSIS — N63 Unspecified lump in unspecified breast: Secondary | ICD-10-CM

## 2020-12-03 ENCOUNTER — Other Ambulatory Visit: Payer: Self-pay

## 2020-12-03 ENCOUNTER — Other Ambulatory Visit: Payer: Managed Care, Other (non HMO)

## 2020-12-03 DIAGNOSIS — Z20822 Contact with and (suspected) exposure to covid-19: Secondary | ICD-10-CM

## 2020-12-05 LAB — SPECIMEN STATUS REPORT

## 2020-12-05 LAB — SARS-COV-2, NAA 2 DAY TAT

## 2020-12-05 LAB — NOVEL CORONAVIRUS, NAA: SARS-CoV-2, NAA: NOT DETECTED

## 2021-05-04 IMAGING — MG DIGITAL DIAGNOSTIC BILAT W/ TOMO W/ CAD
5 of 10 series · 5 of 30 positions shown · non-contrast
Comparison: Previous exams.

CLINICAL DATA: 59-year-old female with reported recent history of
left nipple pruritus and purulence/whitish greenish left nipple
discharge followed by brownish nipple discharge. The patient states
this occurred approximately 1 month ago were the discharge was in a
large amount and has now tapered off and only occurs with forced
expression.

[R CC synth-2D]
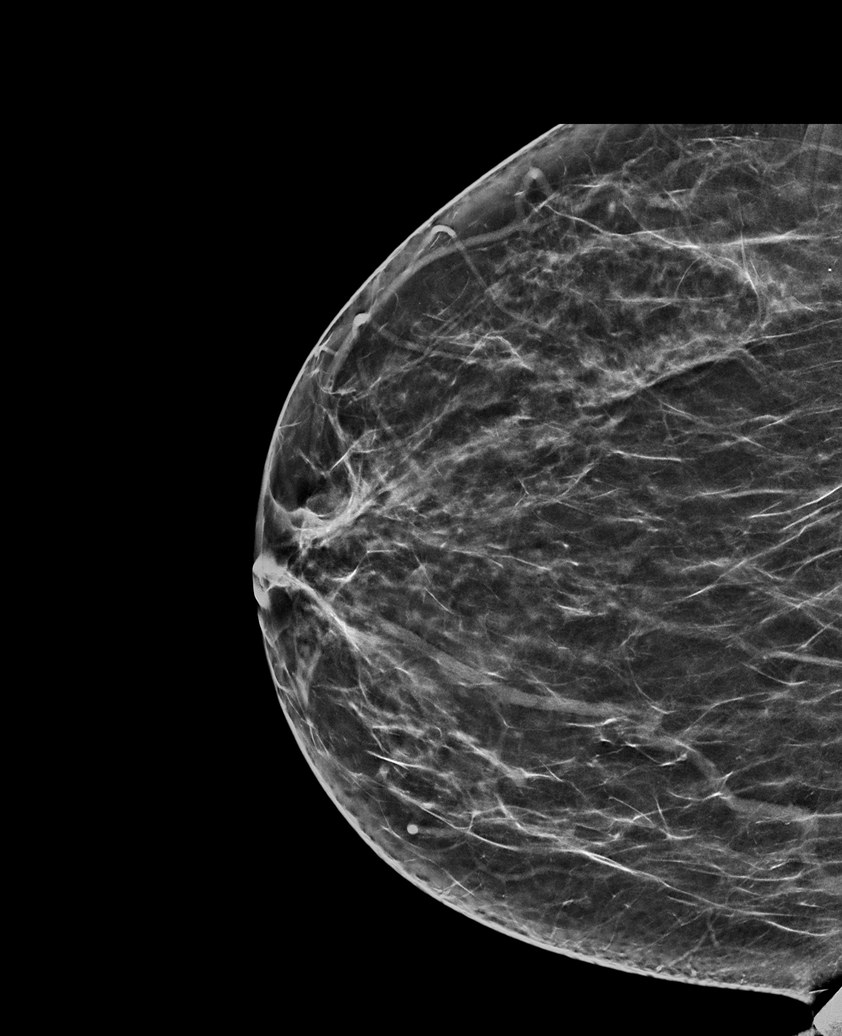

[L CC synth-2D (1 of 2)]
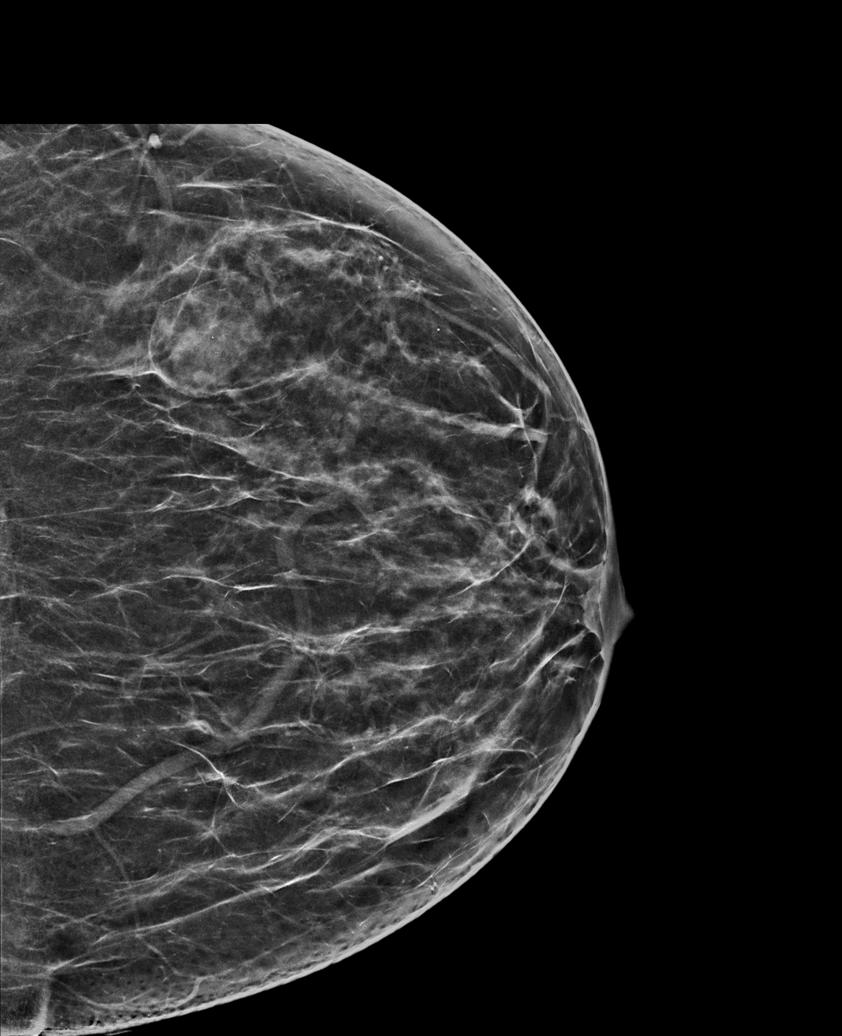

[L CC synth-2D (2 of 2)]
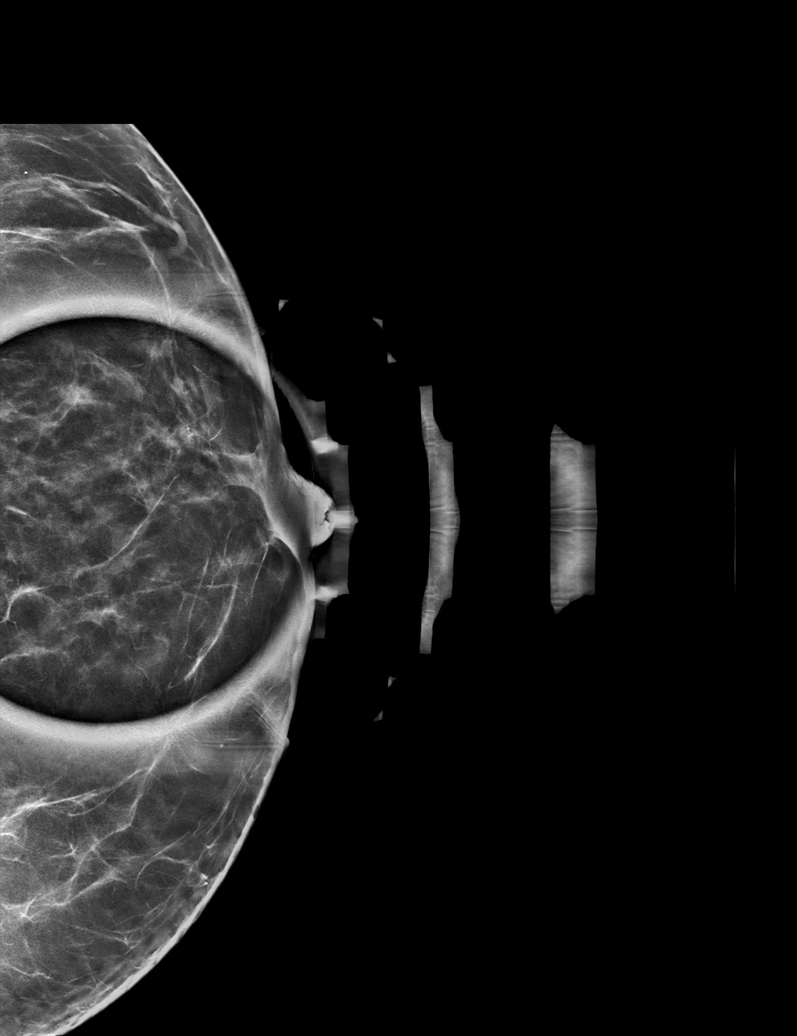

[L MLO synth-2D]
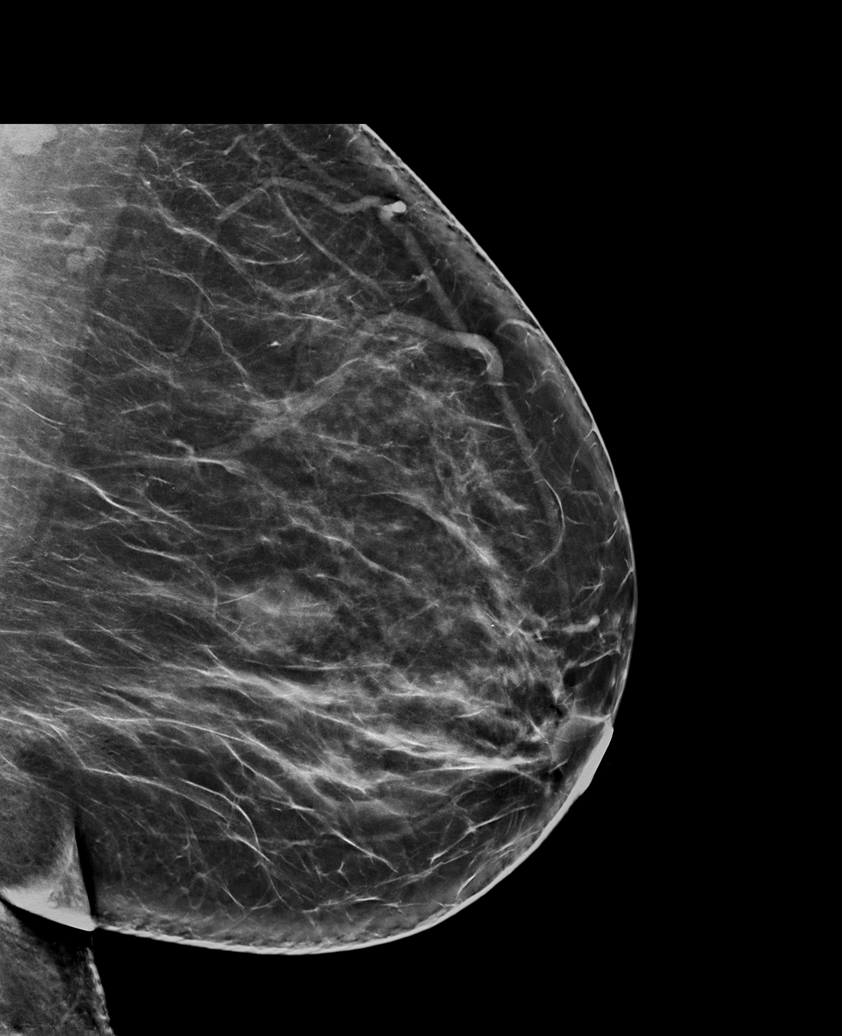

[R MLO synth-2D]
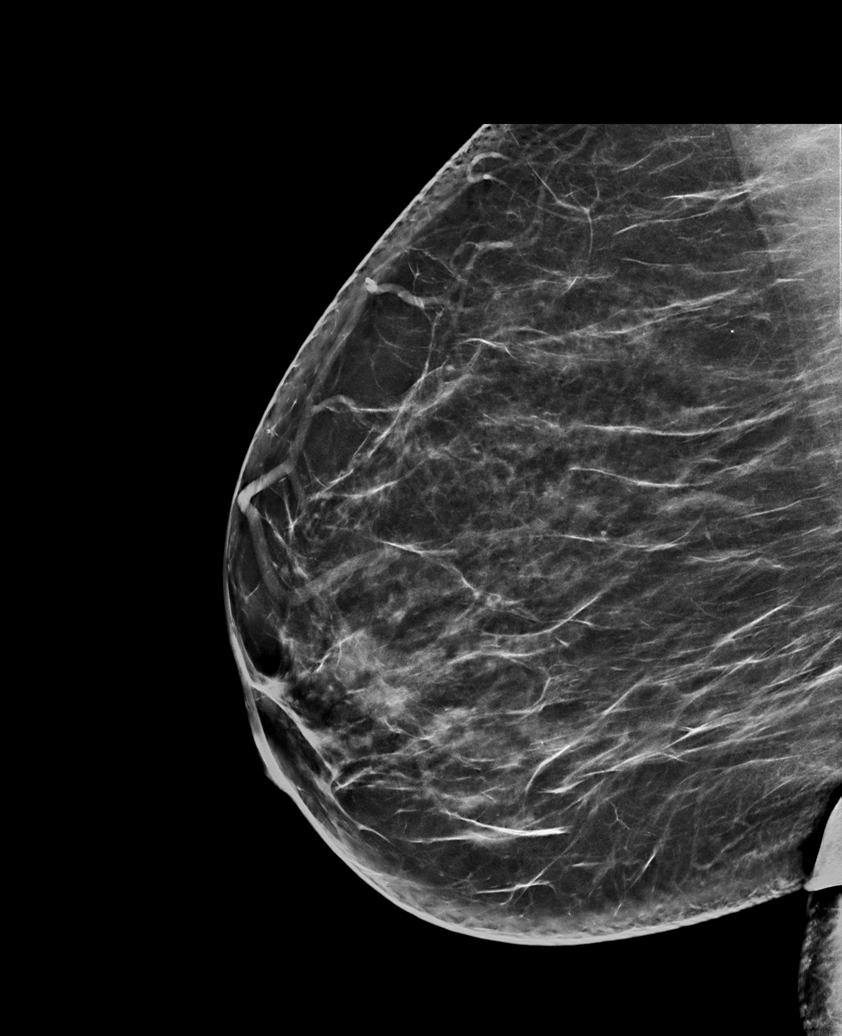

[5 of 30 positions shown; findings below may reference images not displayed]

Biopsy of a mass in the left breast at the 3 o'clock position in
4344 was recommended, however per the patient this could not be
reproduced when the patient returned on the day of her scheduled
biopsy and therefore it was not performed.

EXAM:
DIGITAL DIAGNOSTIC BILATERAL MAMMOGRAM WITH TOMO AND CAD; ULTRASOUND
LEFT BREAST LIMITED
ACR Breast Density Category c: The breast tissue is heterogeneously
dense, which may obscure small masses.
FINDINGS: No suspicious masses or calcifications are seen in the right breast.
Oval mass with obscured margins in the slightly outer left breast
measuring approximately 2.9 cm appears unchanged when compared to
prior mammograms from 4344. There are lymph nodes which may have
increased cortical thickening when compared to the prior exams. Spot
compression cc tomograms were performed over the retroareolar left
breast with no definite abnormality seen.

Mammographic images were processed with CAD.

Physical examination reveals normal appearance of the left nipple
areola complex as compared to the right. The left nipple may be
slightly firmer than the right however no underlying palpable
masses. No nipple discharge observed.

Targeted ultrasound of the left breast was performed. The oval mixed
echogenicity predominantly hyperechoic mass in the left breast at 3
o'clock 5 cm from nipple measures approximately 3.2 x 1.3 x 2.3 cm
felt to be unchanged (stable mammographically since 4344) however
measured differently on today's images when compared to the prior.
Targeted ultrasound of the retroareolar left breast was performed
with no dilated ducts or intraductal masses identified. No
suspicious masses or abnormality seen sonographically.

Targeted ultrasound of the left axilla was performed. There is a
lymph node with moderate cortical thickening in the left axilla,
with cortex thickness measuring 0.5 cm.
IMPRESSION: 1. No mammographic or sonographic abnormalities to explain patient's
episodes of left nipple discharge. This may have been infectious
given patient's description of purulent appearing discharge which
was initially copious/larger amounts and has now diminished/near
resolved.

2. Left axillary lymph nodes with mild cortical thickening, possibly
reactive.

3.  Benign left breast mass, stable since [DATE].  No mammographic evidence of malignancy in the right breast.

RECOMMENDATION:
1. Recommend the patient return in 6 weeks for repeat left axilla
ultrasound for evaluation of the probably benign/probable reactive
lymph node in the left axilla. If this lymph node continues to
appear abnormal then ultrasound-guided core biopsy is recommended.

2. Recommend further management of left nipple discharge be based on
clinical assessment. This is felt to be physiologic/related to a
benign process given yellow/possible purulent color and near
resolution, now only occurring non-spontaneously at this point. The
patient has been instructed to avoid forced expression as this can
induce further discharge. If the patient experiences spontaneous
unilateral bloody or clear nipple discharge from a single duct only,
then further evaluation with breast MRI is recommended.

I have discussed the findings and recommendations with the patient.
If applicable, a reminder letter will be sent to the patient
regarding the next appointment.

BI-RADS CATEGORY  3: Probably benign.

## 2023-10-11 ENCOUNTER — Other Ambulatory Visit: Payer: Self-pay | Admitting: Family Medicine

## 2023-10-11 DIAGNOSIS — Z1231 Encounter for screening mammogram for malignant neoplasm of breast: Secondary | ICD-10-CM

## 2023-11-01 ENCOUNTER — Other Ambulatory Visit: Payer: Self-pay | Admitting: Family Medicine

## 2023-11-01 DIAGNOSIS — R599 Enlarged lymph nodes, unspecified: Secondary | ICD-10-CM

## 2023-11-03 ENCOUNTER — Ambulatory Visit: Payer: Self-pay

## 2023-11-22 ENCOUNTER — Other Ambulatory Visit: Payer: Self-pay

## 2023-11-28 ENCOUNTER — Other Ambulatory Visit: Payer: Self-pay

## 2023-11-28 ENCOUNTER — Ambulatory Visit
Admission: RE | Admit: 2023-11-28 | Discharge: 2023-11-28 | Disposition: A | Discharge status: Home / Self Care | Payer: Self-pay | Source: Ambulatory Visit | Attending: Family Medicine | Admitting: Family Medicine

## 2023-11-28 ENCOUNTER — Ambulatory Visit
Admission: RE | Admit: 2023-11-28 | Discharge: 2023-11-28 | Disposition: A | Discharge status: Home / Self Care | Payer: 59 | Source: Ambulatory Visit | Attending: Family Medicine | Admitting: Family Medicine

## 2023-11-28 DIAGNOSIS — R599 Enlarged lymph nodes, unspecified: Secondary | ICD-10-CM
